# Patient Record
Sex: Male | Born: 2000 | Race: Black or African American | Hispanic: No | Marital: Single | Smoking: Never smoker
Health system: Southern US, Community
[De-identification: ages and names within clinical notes are randomized; demographics above are authoritative.]

---
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
## 2020-04-13 DIAGNOSIS — Z419 Encounter for procedure for purposes other than remedying health state, unspecified: Secondary | ICD-10-CM | POA: Diagnosis not present

## 2020-05-14 DIAGNOSIS — Z419 Encounter for procedure for purposes other than remedying health state, unspecified: Secondary | ICD-10-CM | POA: Diagnosis not present

## 2020-06-14 DIAGNOSIS — Z419 Encounter for procedure for purposes other than remedying health state, unspecified: Secondary | ICD-10-CM | POA: Diagnosis not present

## 2020-07-14 DIAGNOSIS — Z419 Encounter for procedure for purposes other than remedying health state, unspecified: Secondary | ICD-10-CM | POA: Diagnosis not present

## 2020-08-14 DIAGNOSIS — Z419 Encounter for procedure for purposes other than remedying health state, unspecified: Secondary | ICD-10-CM | POA: Diagnosis not present

## 2020-09-13 DIAGNOSIS — Z419 Encounter for procedure for purposes other than remedying health state, unspecified: Secondary | ICD-10-CM | POA: Diagnosis not present

## 2020-10-14 DIAGNOSIS — Z419 Encounter for procedure for purposes other than remedying health state, unspecified: Secondary | ICD-10-CM | POA: Diagnosis not present

## 2020-11-14 DIAGNOSIS — Z419 Encounter for procedure for purposes other than remedying health state, unspecified: Secondary | ICD-10-CM | POA: Diagnosis not present

## 2020-12-12 DIAGNOSIS — Z419 Encounter for procedure for purposes other than remedying health state, unspecified: Secondary | ICD-10-CM | POA: Diagnosis not present

## 2021-01-12 DIAGNOSIS — Z419 Encounter for procedure for purposes other than remedying health state, unspecified: Secondary | ICD-10-CM | POA: Diagnosis not present

## 2021-02-11 DIAGNOSIS — Z419 Encounter for procedure for purposes other than remedying health state, unspecified: Secondary | ICD-10-CM | POA: Diagnosis not present

## 2021-03-14 DIAGNOSIS — Z419 Encounter for procedure for purposes other than remedying health state, unspecified: Secondary | ICD-10-CM | POA: Diagnosis not present

## 2021-04-13 DIAGNOSIS — Z419 Encounter for procedure for purposes other than remedying health state, unspecified: Secondary | ICD-10-CM | POA: Diagnosis not present

## 2021-05-14 DIAGNOSIS — Z419 Encounter for procedure for purposes other than remedying health state, unspecified: Secondary | ICD-10-CM | POA: Diagnosis not present

## 2021-06-14 DIAGNOSIS — Z419 Encounter for procedure for purposes other than remedying health state, unspecified: Secondary | ICD-10-CM | POA: Diagnosis not present

## 2021-07-14 DIAGNOSIS — Z419 Encounter for procedure for purposes other than remedying health state, unspecified: Secondary | ICD-10-CM | POA: Diagnosis not present

## 2021-08-14 DIAGNOSIS — Z419 Encounter for procedure for purposes other than remedying health state, unspecified: Secondary | ICD-10-CM | POA: Diagnosis not present

## 2021-09-13 DIAGNOSIS — Z419 Encounter for procedure for purposes other than remedying health state, unspecified: Secondary | ICD-10-CM | POA: Diagnosis not present

## 2021-10-14 DIAGNOSIS — Z419 Encounter for procedure for purposes other than remedying health state, unspecified: Secondary | ICD-10-CM | POA: Diagnosis not present

## 2021-11-14 DIAGNOSIS — Z419 Encounter for procedure for purposes other than remedying health state, unspecified: Secondary | ICD-10-CM | POA: Diagnosis not present

## 2021-12-12 DIAGNOSIS — Z419 Encounter for procedure for purposes other than remedying health state, unspecified: Secondary | ICD-10-CM | POA: Diagnosis not present

## 2022-01-12 DIAGNOSIS — Z419 Encounter for procedure for purposes other than remedying health state, unspecified: Secondary | ICD-10-CM | POA: Diagnosis not present

## 2022-02-11 DIAGNOSIS — Z419 Encounter for procedure for purposes other than remedying health state, unspecified: Secondary | ICD-10-CM | POA: Diagnosis not present

## 2022-03-14 DIAGNOSIS — Z419 Encounter for procedure for purposes other than remedying health state, unspecified: Secondary | ICD-10-CM | POA: Diagnosis not present

## 2022-04-13 DIAGNOSIS — Z419 Encounter for procedure for purposes other than remedying health state, unspecified: Secondary | ICD-10-CM | POA: Diagnosis not present

## 2022-05-14 DIAGNOSIS — Z419 Encounter for procedure for purposes other than remedying health state, unspecified: Secondary | ICD-10-CM | POA: Diagnosis not present

## 2022-06-14 DIAGNOSIS — Z419 Encounter for procedure for purposes other than remedying health state, unspecified: Secondary | ICD-10-CM | POA: Diagnosis not present

## 2022-07-14 DIAGNOSIS — Z419 Encounter for procedure for purposes other than remedying health state, unspecified: Secondary | ICD-10-CM | POA: Diagnosis not present

## 2022-08-14 DIAGNOSIS — Z419 Encounter for procedure for purposes other than remedying health state, unspecified: Secondary | ICD-10-CM | POA: Diagnosis not present

## 2022-09-13 DIAGNOSIS — Z419 Encounter for procedure for purposes other than remedying health state, unspecified: Secondary | ICD-10-CM | POA: Diagnosis not present

## 2022-10-14 DIAGNOSIS — Z419 Encounter for procedure for purposes other than remedying health state, unspecified: Secondary | ICD-10-CM | POA: Diagnosis not present

## 2022-11-14 DIAGNOSIS — Z419 Encounter for procedure for purposes other than remedying health state, unspecified: Secondary | ICD-10-CM | POA: Diagnosis not present

## 2022-12-13 DIAGNOSIS — Z419 Encounter for procedure for purposes other than remedying health state, unspecified: Secondary | ICD-10-CM | POA: Diagnosis not present

## 2023-01-13 DIAGNOSIS — Z419 Encounter for procedure for purposes other than remedying health state, unspecified: Secondary | ICD-10-CM | POA: Diagnosis not present
